# Patient Record
Sex: Male | Born: 1960 | Race: White | Hispanic: No | Marital: Single
Health system: Southern US, Community
[De-identification: ages and names within clinical notes are randomized; demographics above are authoritative.]

---

## 2004-07-30 ENCOUNTER — Ambulatory Visit: Payer: Self-pay | Admitting: Neurology

## 2006-05-30 ENCOUNTER — Ambulatory Visit: Payer: Self-pay | Admitting: Gastroenterology

## 2011-04-02 ENCOUNTER — Ambulatory Visit: Payer: Self-pay | Admitting: Unknown Physician Specialty

## 2011-05-13 ENCOUNTER — Emergency Department: Payer: Self-pay | Admitting: *Deleted

## 2011-05-17 ENCOUNTER — Ambulatory Visit: Payer: Self-pay | Admitting: Vascular Surgery

## 2011-06-06 ENCOUNTER — Ambulatory Visit: Payer: Self-pay | Admitting: Unknown Physician Specialty

## 2011-09-20 ENCOUNTER — Ambulatory Visit: Payer: Self-pay | Admitting: Vascular Surgery

## 2012-11-19 ENCOUNTER — Other Ambulatory Visit: Payer: Self-pay | Admitting: Gastroenterology

## 2012-11-19 DIAGNOSIS — R1310 Dysphagia, unspecified: Secondary | ICD-10-CM

## 2012-12-05 ENCOUNTER — Ambulatory Visit
Admission: RE | Admit: 2012-12-05 | Discharge: 2012-12-05 | Disposition: A | Discharge status: Home / Self Care | Payer: 59 | Source: Ambulatory Visit | Attending: Gastroenterology | Admitting: Gastroenterology

## 2012-12-05 DIAGNOSIS — R1310 Dysphagia, unspecified: Secondary | ICD-10-CM

## 2014-03-14 ENCOUNTER — Emergency Department: Payer: Self-pay | Admitting: Emergency Medicine

## 2014-09-19 NOTE — Op Note (Signed)
PATIENT NAME:  Melvin Donovan, Melvin Donovan MR#:  [id number] DATE OF BIRTH:  12-03-1960  DATE OF PROCEDURE:  09/20/2011  PREOPERATIVE DIAGNOSES:  1. DVT status post IVC filter placement.  2. Hypertension.   POSTOPERATIVE DIAGNOSES: 1. DVT status post IVC filter placement.  2. Hypertension.   PROCEDURES: 1. Ultrasound guidance for vascular access, right jugular vein.  2. Catheter placement into inferior vena cava.  3. Inferior venacavogram.  4. Retrieval of IVC filter.   SURGEON: Annice NeedyJason S. Dew, MD   ANESTHESIA: Local with moderate conscious sedation.   ESTIMATED BLOOD LOSS: Minimal.   FLUOROSCOPY TIME: Approximately two minutes.   CONTRAST USED: 15 mL.   INDICATION FOR PROCEDURE: This is a 54 year old male who had a filter placed approximately four months ago for deep venous thrombosis. He has done well with anticoagulation. His previous ultrasound showed only chronic changes within the vein with no acute DVT and he returns for removal of his IVC filter to avoid complications of the existing foreign body. Risks and benefits were discussed. Informed consent was obtained.   DESCRIPTION OF PROCEDURE: The patient was brought to the Vascular Interventional Radiology Suite. Neck was sterilely prepped and draped and a sterile surgical field was created. The right jugular vein was visualized with ultrasound and found to be widely patent. It was then accessed under direct ultrasound guidance without difficulty with a Seldinger needle and a J-wire was placed. After skin nick and dilatation, the sheath was placed into the inferior vena cava and inferior venacavogram was then performed. This demonstrated a patent IVC with filter in good location. The snare was then used to capture the hook on the filter. The sheath was advanced. The filter collapsed into the sheath and was removed in its entirety without difficulty and then the retrieval sheath was removed. Pressure was held on the neck. Sterile dressing was  placed. The patient tolerated the procedure well and was taken to the recovery room in stable condition.   ____________________________ Annice NeedyJason S. Dew, MD [email or web address] Donovan: 09/20/2011 09:40:21 ET T: 09/20/2011 12:18:37 ET JOB#: [id number]  cc: Annice NeedyJason S. Dew, MD, <Dictator> Neomia Dearavid N. Harrington Challengerhies, MD Alda BertholdHarold B. Kernodle Jr., MD Annice NeedyJASON S DEW MD ELECTRONICALLY SIGNED 09/24/2011 16:37

## 2014-10-12 IMAGING — RF DG ESOPHAGUS
19 of 24 series · 19 of 24 positions shown · non-contrast
Comparison: None.

CLINICAL DATA: 52-year-old male with dysphagia.

ESOPHAGUS/BARIUM SWALLOW/TABLET STUDY
Fluoroscopy Time: 2-minute-37-second.

[Series 1: run · 1 of 1 slices shown (1 of 19)]
[im 1/1]
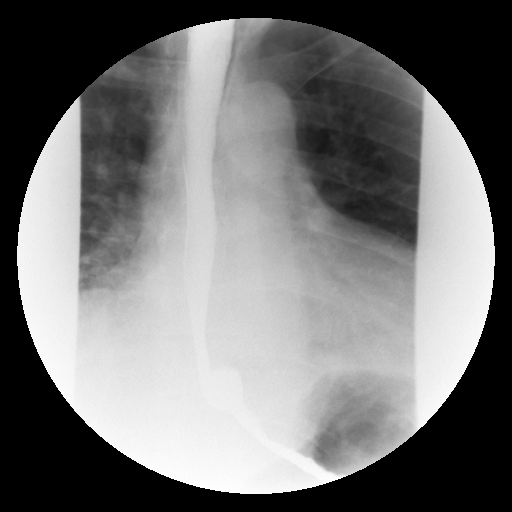

[Series 2: run · 1 of 1 slices shown (2 of 19)]
[im 1/1]
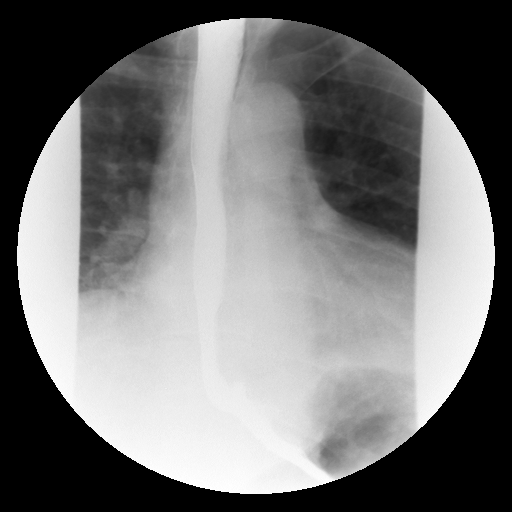

[Series 4: run · 1 of 1 slices shown (3 of 19)]
[im 1/1]
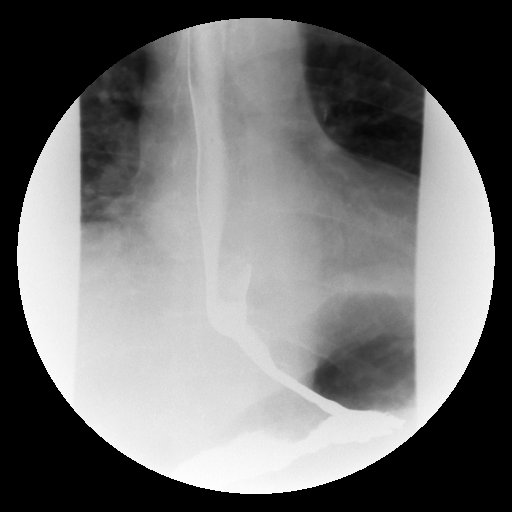

[Series 5: run · 1 of 1 slices shown (4 of 19)]
[im 1/1]
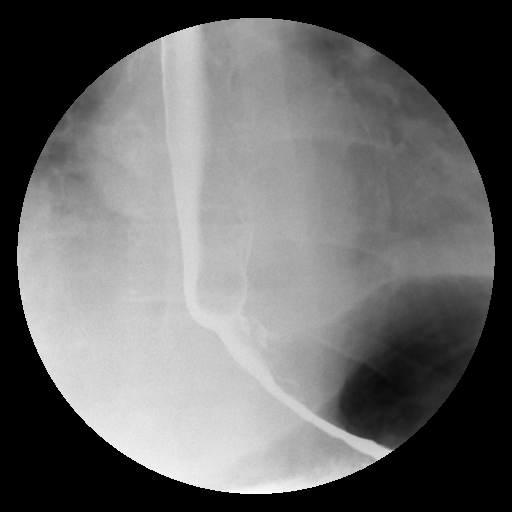

[Series 6: run · 1 of 1 slices shown (5 of 19)]
[im 1/1]
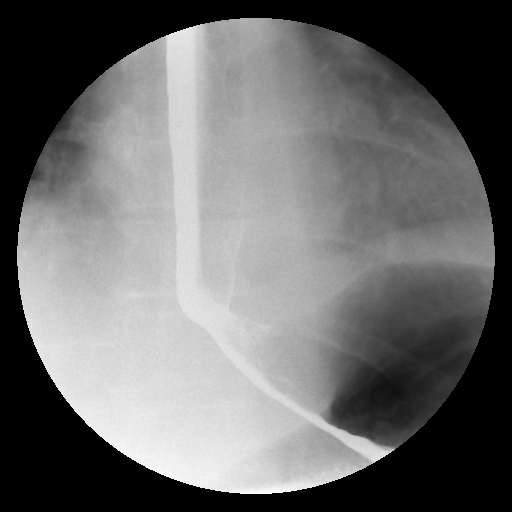

[Series 7: run · 1 of 1 slices shown (6 of 19)]
[im 1/1]
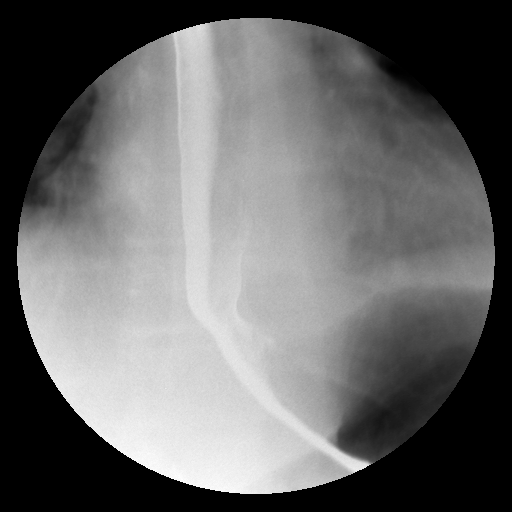

[Series 9: run · 1 of 1 slices shown (7 of 19)]
[im 1/1]
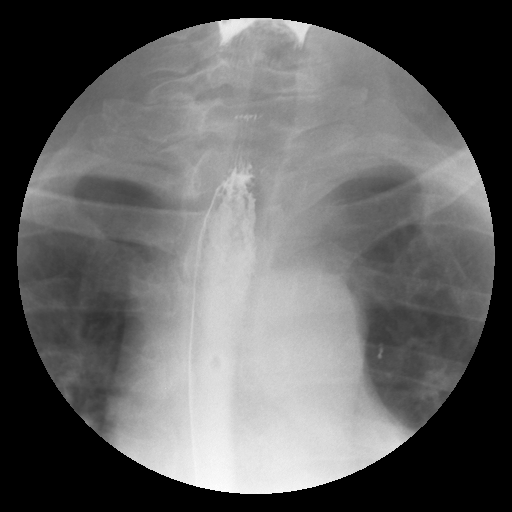

[Series 10: run · 1 of 1 slices shown (8 of 19)]
[im 1/1]
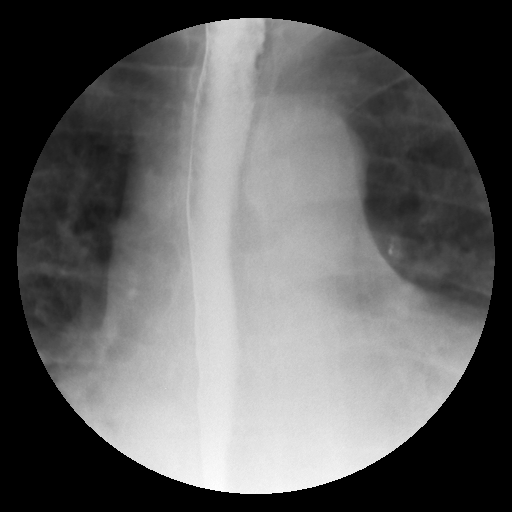

[Series 11: run · 1 of 1 slices shown (9 of 19)]
[im 1/1]
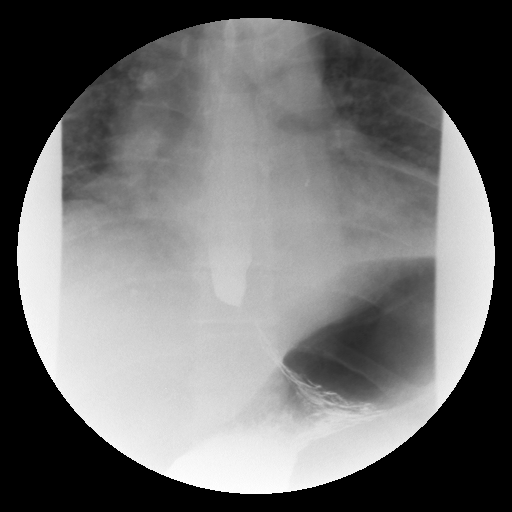

[Series 13: run · 1 of 1 slices shown (10 of 19)]
[im 1/1]
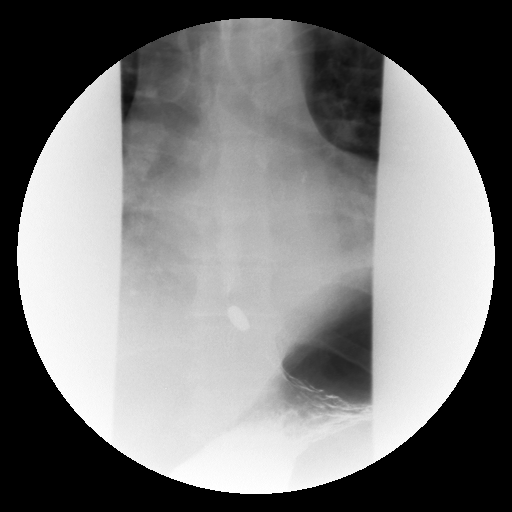

[Series 14: run · 1 of 6 slices shown (11 of 19)]
[im 1/6]
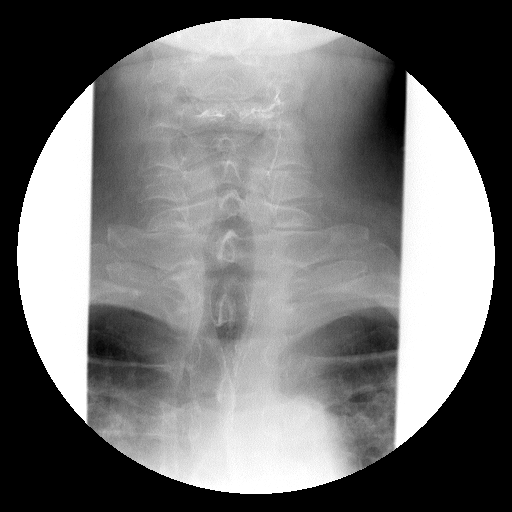

[Series 15: run · 1 of 1 slices shown (12 of 19)]
[im 1/1]
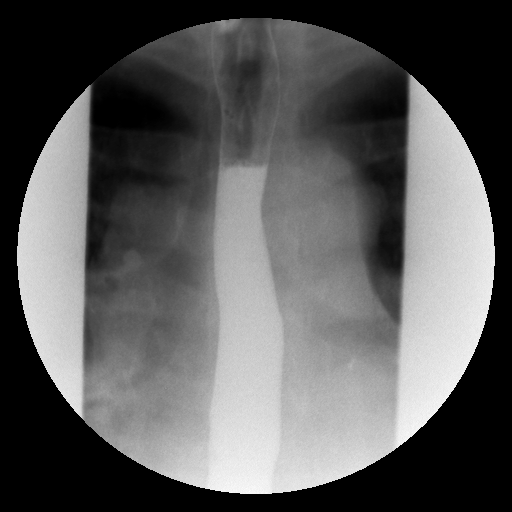

[Series 16: run · 1 of 1 slices shown (13 of 19)]
[im 1/1]
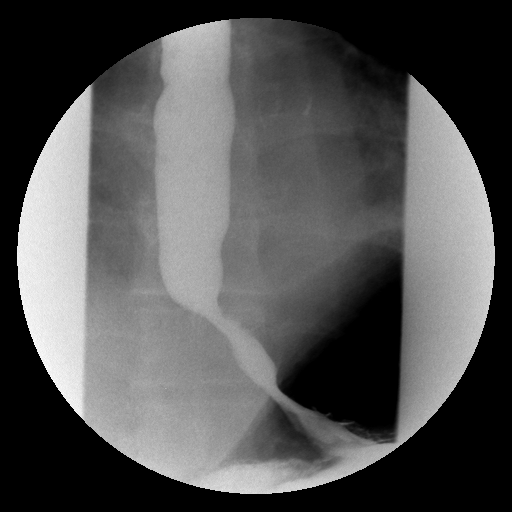

[Series 18: run · 1 of 1 slices shown (14 of 19)]
[im 1/1]
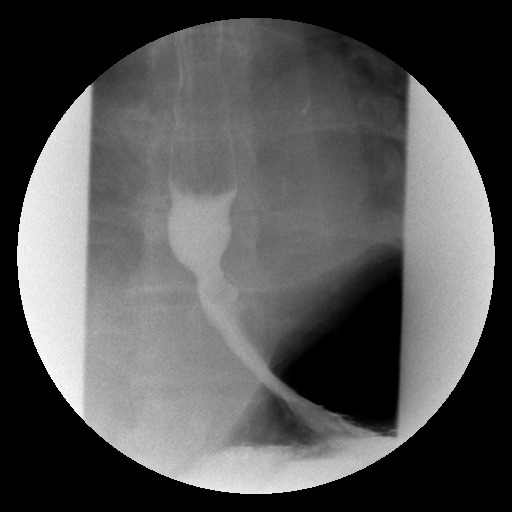

[Series 19: run · 1 of 1 slices shown (15 of 19)]
[im 1/1]
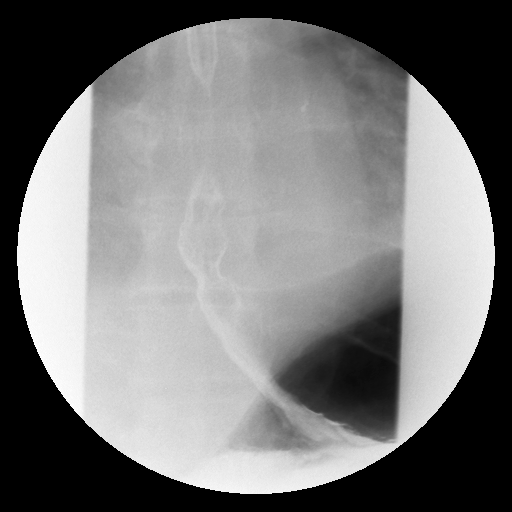

[Series 20: run · 1 of 6 slices shown (16 of 19)]
[im 1/6]
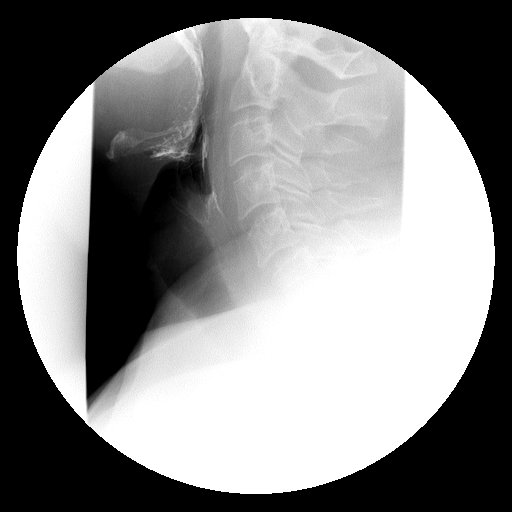

[Series 21: run · 1 of 1 slices shown (17 of 19)]
[im 1/1]
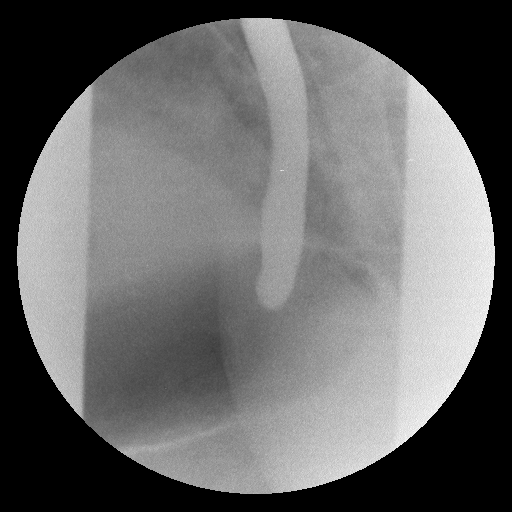

[Series 23: run · 1 of 1 slices shown (18 of 19)]
[im 1/1]
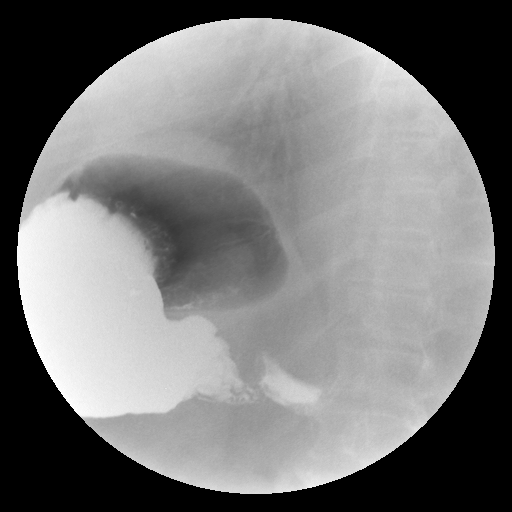

[Series 24: run · 1 of 1 slices shown (19 of 19)]
[im 1/1]
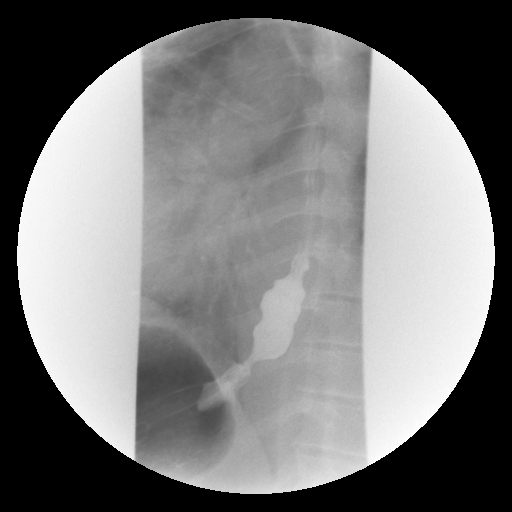

[19 of 24 positions shown; findings below may reference images not displayed]

FINDINGS: A double contrast study was undertaken and the patient
tolerated this well without difficulty.

No obstruction to the forward flow of contrast throughout the
esophagus and into the stomach.

Initially, the gastroesophageal junction is normal.

A 12.5 mm barium tablet passed quickly to the distal esophagus, but
paused at the gastroesophageal junction.  This passed into the
stomach with one additional swallow of thin barium..

Rapid sequence imaging of the cervical esophagus is normal

On prone swallows, proximal release of contrast was noted with
decreased motility of the upper thoracic esophagus.  Occasional
tertiary contractions also occurred (series 24, series 31).
Intermittently, a small sliding-type hiatal hernia became apparent.

Prompt gastric emptying incidentally noted.  With provocative
maneuvers, a trace or small volume of gastroesophageal reflux
occurred (series 40).
IMPRESSION: 1.  Esophageal motility appears diminished, particularly in the
upper thoracic segment.  Occasional tertiary contractions noted.
2.  Small sliding-type hiatal hernia occasionally noted,
significance doubtful.  A trace or small volume of gastroesophageal
reflux occurred with provocation.
